# Patient Record
Sex: Female | Born: 1965 | Race: White | Hispanic: No | Marital: Married | Smoking: Never smoker
Health system: Southern US, Community
[De-identification: ages and names within clinical notes are randomized; demographics above are authoritative.]

## PROBLEM LIST (undated history)

## (undated) HISTORY — PX: ABDOMINAL HYSTERECTOMY: SHX81

---

## 2006-05-22 ENCOUNTER — Ambulatory Visit: Payer: Self-pay | Admitting: Obstetrics and Gynecology

## 2006-06-03 ENCOUNTER — Ambulatory Visit: Payer: Self-pay | Admitting: Obstetrics and Gynecology

## 2007-06-11 ENCOUNTER — Ambulatory Visit: Payer: Self-pay | Admitting: Obstetrics and Gynecology

## 2008-06-14 ENCOUNTER — Ambulatory Visit: Payer: Self-pay | Admitting: Obstetrics and Gynecology

## 2009-07-25 ENCOUNTER — Ambulatory Visit: Payer: Self-pay | Admitting: Obstetrics and Gynecology

## 2010-07-27 ENCOUNTER — Ambulatory Visit: Payer: Self-pay | Admitting: Obstetrics and Gynecology

## 2011-07-30 ENCOUNTER — Ambulatory Visit: Payer: Self-pay | Admitting: Obstetrics and Gynecology

## 2012-08-24 ENCOUNTER — Ambulatory Visit: Payer: Self-pay | Admitting: Obstetrics and Gynecology

## 2012-12-07 ENCOUNTER — Ambulatory Visit: Payer: Self-pay | Admitting: Obstetrics and Gynecology

## 2012-12-07 LAB — URINALYSIS, COMPLETE
Bilirubin,UR: NEGATIVE
Glucose,UR: NEGATIVE mg/dL (ref 0–75)
Ph: 5 (ref 4.5–8.0)
Protein: NEGATIVE
RBC,UR: 75 /HPF (ref 0–5)
Specific Gravity: 1.024 (ref 1.003–1.030)
Squamous Epithelial: 1
WBC UR: 8 /HPF (ref 0–5)

## 2012-12-14 ENCOUNTER — Inpatient Hospital Stay: Payer: Self-pay | Admitting: Obstetrics and Gynecology

## 2012-12-15 LAB — HEMATOCRIT: HCT: 29.4 % — ABNORMAL LOW (ref 35.0–47.0)

## 2013-08-25 ENCOUNTER — Ambulatory Visit: Payer: Self-pay | Admitting: Obstetrics and Gynecology

## 2014-04-29 NOTE — Op Note (Signed)
PATIENT NAME:  Tami Rivera, Tami Rivera MR#:  [id number] DATE OF BIRTH:  Sep 24, 1965  DATE OF PROCEDURE:  12/14/2012  PREOPERATIVE DIAGNOSES: Dysmenorrhea, menorrhagia, and fibroids.   POSITIVE DIAGNOSES:  Dysmenorrhea, menorrhagia, and fibroids.   PROCEDURE:  Subtotal abdominal hysterectomy and bilateral salpingectomy.   SURGEON: Ricky L. Logan BoresEvans, M.D.   ASSISTANT: Jennell Cornerhomas Schermerhorn,  M.D.   ANESTHESIA: General endotracheal.   FINDINGS: Enlarged fibroid uterus, grossly normal tubes and ovaries bilaterally.   ESTIMATED BLOOD LOSS: 75 mL.   COMPLICATIONS: None.   DRAINS: Foley.   SPECIMENS: As above.  PROCEDURE IN DETAIL: The patient was consented. Preoperative antibiotics were given. She was taken to the Operating Room and placed in the supine position where anesthesia was initiated. She was then placed in the dorsal lithotomy position using Allen stirrups, prepped and draped in the usual sterile fashion. Foley catheter was inserted and #10 blade was used to create a Pfannenstiel incision which was carried sharply down to the peritoneal cavity, self-retaining retractor was placed after bowel was packed with three moist laps. Double-tooth was placed in the dome of the uterus. [name] was placed in bilateral adnexal complexes. Round ligament was identified, ligated and divided. Broad ligament was developed. This was done on the right side as well and then doing clamp, cut and tie technique brought down to just above the uterosacrals bilaterally and then decapitated the uterus and the underlying cervix. Cervical canal was cauterized and imbricating stitch with 0 Vicryl was used to re-peritonealize the cervical stump. Copiously irrigated with water seen to be hemostatic. Packing removed. Self-retaining retractor was removed. Rectus muscle was inspected for hemostasis and seen to be excellent. Fascia was closed left to right with 0 Vicryl. Subcutaneous was made hemostatic with cautery clips were applied and  sterile dressing was applied. The patient tolerated the procedure well. All instrument, needle, and sponge counts were correct. I anticipate a routine postoperative course.    ____________________________ Reatha Harpsicky L. Logan BoresEvans, MD rle:sg D: 12/14/2012 08:34:24 ET T: 12/14/2012 09:05:25 ET JOB#: [id number]  cc: Clide Clifficky L. Logan BoresEvans, MD, <Dictator>  Augustina MoodICK L Fronia Depass MD ELECTRONICALLY SIGNED 12/16/2012 13:10

## 2016-07-02 ENCOUNTER — Emergency Department: Payer: BLUE CROSS/BLUE SHIELD

## 2016-07-02 ENCOUNTER — Emergency Department
Admission: EM | Admit: 2016-07-02 | Discharge: 2016-07-02 | Disposition: A | Discharge status: Home / Self Care | Payer: BLUE CROSS/BLUE SHIELD | Attending: Student in an Organized Health Care Education/Training Program | Admitting: Student in an Organized Health Care Education/Training Program

## 2016-07-02 ENCOUNTER — Encounter: Payer: Self-pay | Admitting: Emergency Medicine

## 2016-07-02 DIAGNOSIS — S62662B Nondisplaced fracture of distal phalanx of right middle finger, initial encounter for open fracture: Secondary | ICD-10-CM | POA: Diagnosis not present

## 2016-07-02 DIAGNOSIS — S61222A Laceration with foreign body of right middle finger without damage to nail, initial encounter: Secondary | ICD-10-CM | POA: Diagnosis present

## 2016-07-02 DIAGNOSIS — W312XXA Contact with powered woodworking and forming machines, initial encounter: Secondary | ICD-10-CM | POA: Insufficient documentation

## 2016-07-02 DIAGNOSIS — Y999 Unspecified external cause status: Secondary | ICD-10-CM | POA: Diagnosis not present

## 2016-07-02 DIAGNOSIS — S61212A Laceration without foreign body of right middle finger without damage to nail, initial encounter: Secondary | ICD-10-CM

## 2016-07-02 DIAGNOSIS — Y929 Unspecified place or not applicable: Secondary | ICD-10-CM | POA: Insufficient documentation

## 2016-07-02 DIAGNOSIS — Y93H3 Activity, building and construction: Secondary | ICD-10-CM | POA: Diagnosis not present

## 2016-07-02 MED ORDER — SULFAMETHOXAZOLE-TRIMETHOPRIM 800-160 MG PO TABS
1.0000 | ORAL_TABLET | Freq: Once | ORAL | Status: AC
  Administered 2016-07-02: 1 via ORAL
  Filled 2016-07-02: qty 1

## 2016-07-02 MED ORDER — LIDOCAINE HCL (PF) 1 % IJ SOLN
10.0000 mL | Freq: Once | INTRAMUSCULAR | Status: AC
  Administered 2016-07-02: 5 mL

## 2016-07-02 MED ORDER — OXYCODONE-ACETAMINOPHEN 7.5-325 MG PO TABS: 1.0000 | ORAL_TABLET | ORAL | 0 refills | Status: AC | PRN

## 2016-07-02 MED ORDER — LIDOCAINE HCL (PF) 1 % IJ SOLN
INTRAMUSCULAR | Status: AC
  Filled 2016-07-02: qty 5

## 2016-07-02 MED ORDER — OXYCODONE-ACETAMINOPHEN 5-325 MG PO TABS
1.0000 | ORAL_TABLET | Freq: Once | ORAL | Status: AC
  Administered 2016-07-02: 1 via ORAL
  Filled 2016-07-02: qty 1

## 2016-07-02 MED ORDER — LIDOCAINE HCL (PF) 1 % IJ SOLN
INTRAMUSCULAR | Status: AC
  Administered 2016-07-02: 5 mL
  Filled 2016-07-02: qty 5

## 2016-07-02 MED ORDER — SULFAMETHOXAZOLE-TRIMETHOPRIM 800-160 MG PO TABS: 1.0000 | ORAL_TABLET | Freq: Two times a day (BID) | ORAL | 0 refills | Status: DC

## 2016-07-02 NOTE — Discharge Instructions (Signed)
Wear splint until evaluation by orthopedic

## 2016-07-02 NOTE — ED Provider Notes (Signed)
[facility] Emergency Department Provider Note   ____________________________________________   First MD Initiated Contact with Patient 07/02/16 1148     (approximate)  I have reviewed the triage vital signs and the nursing notes.   HISTORY  Chief Complaint Laceration    HPI Tami Rivera is a 51 y.o. female patient complaining of laceration to third digit right hand second to a circular saw cut. Patient states she was helping a relative deck. Patient denies loss sensation or loss of function of the finger. Patient is right-hand dominant. Patient tetanus shot is up-to-date. Patient was sent over from [place] clinic for definitive evaluation and treatment.   History reviewed. No pertinent past medical history.  There are no active problems to display for this patient.   Past Surgical History:  Procedure Laterality Date  . ABDOMINAL HYSTERECTOMY      Prior to Admission medications   Medication Sig Start Date End Date Taking? Authorizing Provider  phentermine 15 MG capsule Take 15 mg by mouth every morning.   Yes [provider]  oxyCODONE-acetaminophen (PERCOCET) 7.5-325 MG tablet Take 1 tablet by mouth every 4 (four) hours as needed for severe pain. 07/02/16 07/02/17  Joni Reining, PA-C  sulfamethoxazole-trimethoprim (BACTRIM DS,SEPTRA DS) 800-160 MG tablet Take 1 tablet by mouth 2 (two) times daily. 07/02/16   Joni Reining, PA-C    Allergies Patient has no known allergies.  No family history on file.  Social History Social History  Substance Use Topics  . Smoking status: Never Smoker  . Smokeless tobacco: Never Used  . Alcohol use No    Review of Systems Constitutional: No fever/chills Eyes: No visual changes. ENT: No sore throat. Cardiovascular: Denies chest pain. Respiratory: Denies shortness of breath. Gastrointestinal: No abdominal pain.  No nausea, no vomiting.  No diarrhea.  No constipation. Genitourinary:  Negative for dysuria. Musculoskeletal: Negative for back pain. Skin: Laceration distal third right hand Neurological: Negative for headaches, focal weakness or numbness.   ____________________________________________   PHYSICAL EXAM:  VITAL SIGNS: ED Triage Vitals [07/02/16 1130]  Enc Vitals Group     BP (!) 142/79     Pulse Rate 72     Resp 18     Temp 97.7 F (36.5 C)     Temp Source Oral     SpO2 99 %     Weight 273 lb (123.8 kg)     Height 5\' 8"  (1.727 m)     Head Circumference      Peak Flow      Pain Score 8     Pain Loc      Pain Edu?      Excl. in GC?     Constitutional: Alert and oriented. Well appearing and in no acute distress. Cardiovascular: Normal rate, regular rhythm. Grossly normal heart sounds.  Good peripheral circulation. Respiratory: Normal respiratory effort.  No retractions. Lungs CTAB. Gastrointestinal: Soft and nontender. No distention. No abdominal bruits. No CVA tenderness. Neurologic:  Normal speech and language. No gross focal neurologic deficits are appreciated. No gait instability. Skin:  Skin is warm, dry and intact. No rash noted.Laceration third digit right hand Psychiatric: Mood and affect are normal. Speech and behavior are normal.  ____________________________________________   LABS (all labs ordered are listed, but only abnormal results are displayed)  Labs Reviewed - No data to display ____________________________________________  EKG   ____________________________________________  RADIOLOGY  Dg Finger Middle Right  Result Date: 07/02/2016 CLINICAL DATA:  Sol blade injury  of the right third digit. EXAM: RIGHT MIDDLE FINGER 2+V COMPARISON:  None in PACs FINDINGS: The patient has sustained an open fracture through the ulnar aspect of the base of the distal phalanx of the third finger. The fracture is intra-articular. The proximal and middle phalanges are intact. There is disruption of the soft tissues of the distal phalanx.  With radiopaque foreign material IMPRESSION: Open intra-articular slightly distracted fracture through the ulnar aspect of the base of the distal phalanx of the right third finger. Electronically Signed   By: David  SwazilandJordan M.D.   On: 07/02/2016 12:24    ___________ _________________________________   PROCEDURES  Procedure(s) performed: LACERATION REPAIR Performed by: Joni Reiningonald K Tallulah Hosman Authorized by: Joni Reiningonald K Johnie Stadel Consent: Verbal consent obtained. Risks and benefits: risks, benefits and alternatives were discussed Consent given by: patient Patient identity confirmed: provided demographic data Prepped and Draped in normal sterile fashion Wound explored  Laceration Location: Distal third digit right hand Laceration Length: 1.5 cm   Foreign Bodies seen and removed for forceps and irrigation Anesthesia: Digital block   Local anesthetic: lidocaine 1% without epinephrine.   Anesthetic total: 6 mL Irrigation method: syringe Amount of cleaning: standard  Skin closure: Nylon Number of sutures: 9 Technique: Interrupted Patient tolerance: Patient tolerated the procedure well with no immediate complications.   Procedures  Critical Care performed: No  ____________________________________________   INITIAL IMPRESSION / ASSESSMENT AND PLAN / ED COURSE  Pertinent labs & imaging results that were available during my care of the patient were reviewed by me and considered in my medical decision making (see chart for details).  Open fracture distal phalange third digit right hand. Area was thoroughly irrigated and loose approximated the patient can follow-up with orthopedics. Patient started on antibiotics in clinic. Advised to wear his splint until evaluation by orthopedic clinic      ____________________________________________   FINAL CLINICAL IMPRESSION(S) / ED DIAGNOSES  Final diagnoses:  Laceration of right middle finger, foreign body presence unspecified, nail damage status  unspecified, initial encounter      NEW MEDICATIONS STARTED DURING THIS VISIT:  New Prescriptions   OXYCODONE-ACETAMINOPHEN (PERCOCET) 7.5-325 MG TABLET    Take 1 tablet by mouth every 4 (four) hours as needed for severe pain.   SULFAMETHOXAZOLE-TRIMETHOPRIM (BACTRIM DS,SEPTRA DS) 800-160 MG TABLET    Take 1 tablet by mouth 2 (two) times daily.     Note:  This document was prepared using Dragon voice recognition software and may include unintentional dictation errors.    Joni ReiningSmith, Oz Gammel K, PA-C 07/02/16 1309    Willy Eddyobinson, Patrick, MD 07/02/16 [phone]

## 2016-07-02 NOTE — ED Triage Notes (Signed)
Presents with laceration to right middle finger with saw  States she was help her father build a deck

## 2018-01-10 IMAGING — CR DG FINGER MIDDLE 2+V*R*
3 series · 3 of 3 positions shown · non-contrast
Comparison: None in PACs

CLINICAL DATA: Kiersten Opara injury of the right third digit.

EXAM:
RIGHT MIDDLE FINGER 2+V

[finger ap]
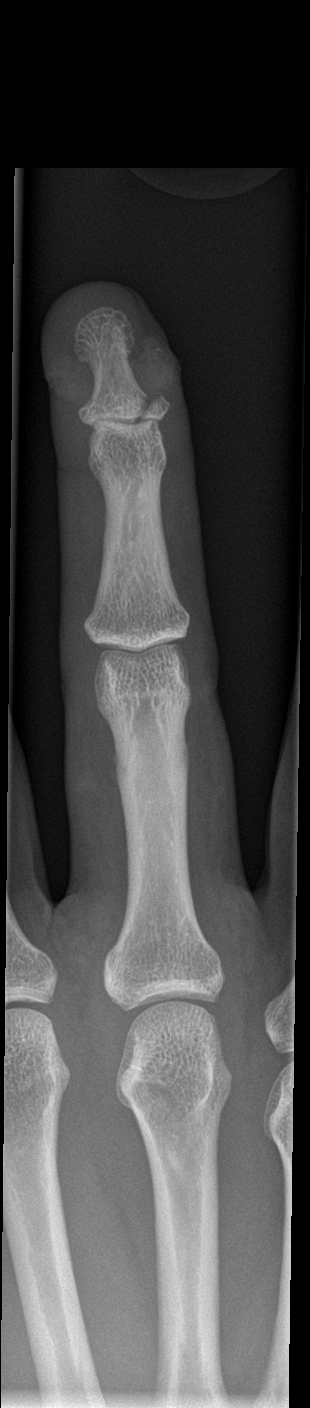

[finger obl]
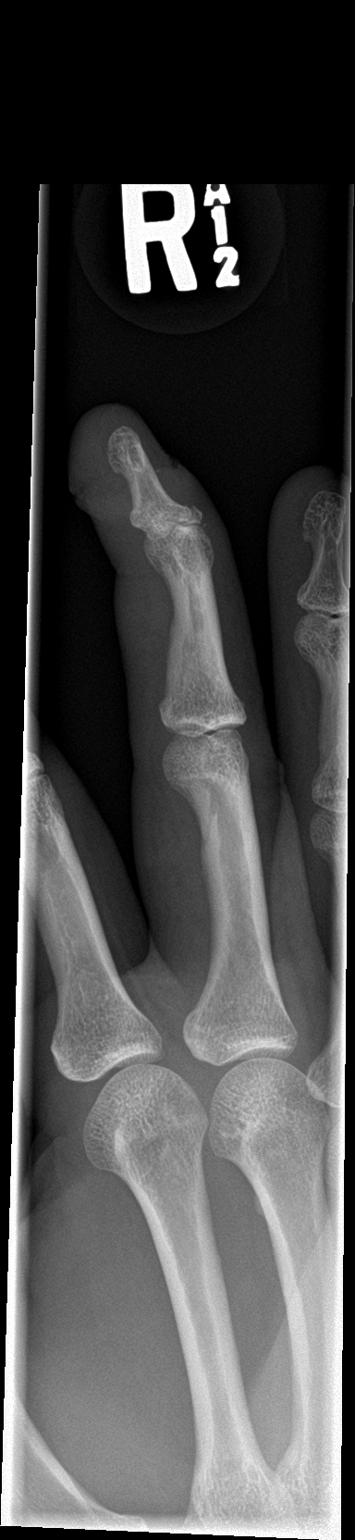

[finger lat]
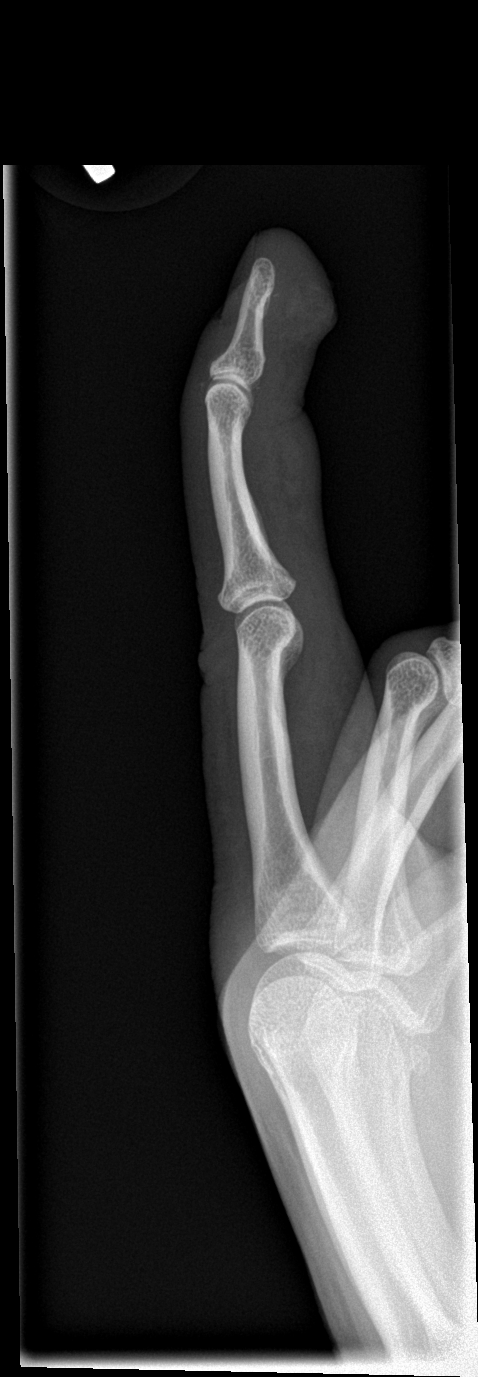

[3 of 3 positions shown; findings below may reference images not displayed]

FINDINGS: The patient has sustained an open fracture through the ulnar aspect
of the base of the distal phalanx of the third finger. The fracture
is intra-articular. The proximal and middle phalanges are intact.
There is disruption of the soft tissues of the distal phalanx. With
radiopaque foreign material
IMPRESSION: Open intra-articular slightly distracted fracture through the ulnar
aspect of the base of the distal phalanx of the right third finger.

## 2018-09-22 ENCOUNTER — Telehealth: Payer: Self-pay

## 2018-09-22 NOTE — Telephone Encounter (Signed)
Message reviewed and patient called. No fevers, more sinus congestion now, a little bit of drainage, more clear drainage. Some mild cough, mild HA, not marked aches Taking OTC med wth expectorant and DM and acetaminophen and helps some. This time of year gets seasonal allergies.  Slept near a vent in the mountains in [place] when there on a recent trip. No loss of taste or smell or known Covid exposures  Educated on possible viral URI with allergic rhinitis as well, feel much less likely Covid. Can continue the OTC product for any mild cough or congestion and adding a non-sedating antihistamine rec'ed and also a generic flonase nasal spray - up to twice daily for first three days, then once daily.  No added tylenol as in the OTC product, and can use ibuprofen prn alternating to help.   Noted have a low yield for Covid testing presently, and since works at home can hold on going today, but noted if sx's not improving with the above over the next couple days, would be good to get tested to make sure not Covid.  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  If not getting better or worsening, to f/u as well.

## 2018-11-29 DIAGNOSIS — Z20828 Contact with and (suspected) exposure to other viral communicable diseases: Secondary | ICD-10-CM | POA: Diagnosis not present

## 2019-03-31 DIAGNOSIS — J019 Acute sinusitis, unspecified: Secondary | ICD-10-CM | POA: Diagnosis not present

## 2019-03-31 DIAGNOSIS — Z03818 Encounter for observation for suspected exposure to other biological agents ruled out: Secondary | ICD-10-CM | POA: Diagnosis not present

## 2019-03-31 DIAGNOSIS — B9689 Other specified bacterial agents as the cause of diseases classified elsewhere: Secondary | ICD-10-CM | POA: Diagnosis not present

## 2019-06-03 DIAGNOSIS — H1132 Conjunctival hemorrhage, left eye: Secondary | ICD-10-CM | POA: Diagnosis not present

## 2019-12-06 DIAGNOSIS — S93601A Unspecified sprain of right foot, initial encounter: Secondary | ICD-10-CM | POA: Diagnosis not present

## 2021-03-12 ENCOUNTER — Encounter: Payer: Self-pay | Admitting: Physician Assistant

## 2021-03-12 ENCOUNTER — Other Ambulatory Visit: Payer: Self-pay

## 2021-03-12 ENCOUNTER — Ambulatory Visit: Payer: Self-pay | Admitting: Physician Assistant

## 2021-03-12 VITALS — BP 127/73 | HR 81 | Temp 98.0°F | Resp 14 | Ht 68.0 in | Wt 289.0 lb

## 2021-03-12 DIAGNOSIS — J029 Acute pharyngitis, unspecified: Secondary | ICD-10-CM

## 2021-03-12 DIAGNOSIS — J069 Acute upper respiratory infection, unspecified: Secondary | ICD-10-CM

## 2021-03-12 DIAGNOSIS — Z1152 Encounter for screening for COVID-19: Secondary | ICD-10-CM

## 2021-03-12 LAB — POCT RAPID STREP A (OFFICE): Rapid Strep A Screen: NEGATIVE

## 2021-03-12 LAB — POC COVID19 BINAXNOW: SARS Coronavirus 2 Ag: NEGATIVE

## 2021-03-12 MED ORDER — METHYLPREDNISOLONE 4 MG PO TBPK: ORAL_TABLET | ORAL | 0 refills | Status: DC

## 2021-03-12 MED ORDER — PSEUDOEPH-BROMPHEN-DM 30-2-10 MG/5ML PO SYRP: 5.0000 mL | ORAL_SOLUTION | Freq: Four times a day (QID) | ORAL | 0 refills | Status: DC | PRN

## 2021-03-12 MED ORDER — LIDOCAINE VISCOUS HCL 2 % MT SOLN: 5.0000 mL | Freq: Four times a day (QID) | OROMUCOSAL | 0 refills | Status: DC | PRN

## 2021-03-12 NOTE — Progress Notes (Signed)
Pt stating she's doing better then 03/05/21 but throat still starchy, cough and chest feeling  tightness from coughing. ?

## 2021-03-12 NOTE — Progress Notes (Signed)
Pt presents today with sore throat and congestion with cough since 03/05/21.  ? ?Negative rapid covid and strept today. ?

## 2021-03-12 NOTE — Progress Notes (Signed)
? ?  Subjective: Cough and sore throat  ? ? Patient ID: Tami Rivera, female    DOB: 20-Mar-1965, 56 y.o.   MRN: [id number] ? ?HPI ?Patient complaining of 1 week of cough and sore throat.  Patient states intermitting nasal congestion.  Patient denies fevers or chills.  Patient cough is worse when laying down.  Denies recent travel or known contact COVID-19.  Patient tested negative for strep pharyngitis and COVID-19. ? ? ?Review of Systems ?Negative except for chief complaint ?   ?Objective:  ? Physical Exam ? ?No acute distress.  Temperature is 98, pulse 81, respiration 14, BP is 127/73, and patient 90% O2 sat on room air.  Patient weighs 289 pounds BMI is 43.94. ?HEENT is remarkable for postnasal drainage and erythematous pharynx.  No exudate.  No cervical lymphadenopathy.  Lungs are clear to auscultation.  Heart regular rate and rhythm. ? ? ?   ?Assessment & Plan: Viral pharyngitis  ? ? ?

## 2021-08-07 ENCOUNTER — Encounter: Payer: Self-pay | Admitting: Physician Assistant

## 2021-08-07 ENCOUNTER — Ambulatory Visit: Payer: Self-pay | Admitting: Physician Assistant

## 2021-08-07 VITALS — BP 127/80 | HR 82 | Temp 96.8°F | Resp 16 | Wt 201.0 lb

## 2021-08-07 DIAGNOSIS — R053 Chronic cough: Secondary | ICD-10-CM

## 2021-08-07 MED ORDER — HYDROCOD POLI-CHLORPHE POLI ER 10-8 MG/5ML PO SUER: 5.0000 mL | Freq: Every evening | ORAL | 0 refills | Status: DC | PRN

## 2021-08-07 MED ORDER — BENZONATATE 200 MG PO CAPS: 200.0000 mg | ORAL_CAPSULE | Freq: Three times a day (TID) | ORAL | 0 refills | Status: DC | PRN

## 2021-08-07 NOTE — Progress Notes (Signed)
   Subjective: Cough    Patient ID: Tami Rivera, female    DOB: September 12, 1965, 56 y.o.   MRN: [id number]  HPI Patient complain of cough for 4 weeks.  Patient states cough is productive with a.m. awakening and is nonproductive throughout the day.  Denies fever chills associated complaint.  Denies night sweats.  Denies recent travel or known contact with COVID-19.  Patient voiced concern because she is planning on going to the beach for 1 week.   Review of Systems Negative except for chief complaint    Objective:   Physical Exam  BP is 127/80, pulse 82, respirations 16, temperature 96.8, patient 96% O2 sat on room air.  Patient weighs 201 pounds BMI is 30.56. HEENT is unremarkable.  Neck is supple without lymphadenectomy or bruits.  Lungs are clear to auscultation.  Heart regular rate and rhythm.      Assessment & Plan: Cough  Differentials consist of viral URI, bronchitis, or pneumonia.  Patient given a prescription for Tessalon Perles to take 3 times daily.  Patient also given a prescription for Tussionex and advised to take 5 mL before going to bed.  Patient will follow-up as necessary.

## 2021-08-07 NOTE — Progress Notes (Signed)
C/O cough intermittent 5-6 weeks stating nonproductive daytime with some productive cough in am, no dyspnea at rest noted but states mild dyspnea with exertion in heat.  No wheezing noted at this time at rest.  No c/o fever.

## 2022-05-15 ENCOUNTER — Ambulatory Visit: Payer: Self-pay | Admitting: Emergency Medicine

## 2022-05-15 VITALS — BP 132/83 | HR 70 | Temp 97.8°F | Resp 16

## 2022-05-15 DIAGNOSIS — J302 Other seasonal allergic rhinitis: Secondary | ICD-10-CM

## 2022-05-15 DIAGNOSIS — R059 Cough, unspecified: Secondary | ICD-10-CM

## 2022-05-15 LAB — POC COVID19 BINAXNOW: SARS Coronavirus 2 Ag: NEGATIVE

## 2022-05-15 MED ORDER — FLUTICASONE PROPIONATE 50 MCG/ACT NA SUSP: 2.0000 | Freq: Every day | NASAL | 6 refills | Status: DC

## 2022-05-15 MED ORDER — BENZONATATE 200 MG PO CAPS: 200.0000 mg | ORAL_CAPSULE | Freq: Three times a day (TID) | ORAL | 0 refills | Status: DC | PRN

## 2022-05-15 MED ORDER — PREDNISONE 10 MG PO TABS: ORAL_TABLET | ORAL | 0 refills | Status: DC

## 2022-05-15 NOTE — Progress Notes (Signed)
C/O productive at time cough that persists x 2 weeks.  Tested negative for covid in parking lot prior to clinic visit.  Stated Zyrtec not working and using Benadryl at night and Chloricedin daytime.  No fever with mild sinus pressure, but nasal drainage excessive reported from usual.

## 2022-05-15 NOTE — Progress Notes (Unsigned)
57 year old female presents to the clinic with upper respiratory issues.  Patient has been taking Zyrtec and Benadryl without any relief.  Patient reports nasal congestion and a scratchy cough mostly at night.  She denies any fever, chills, nausea or vomiting.  O: EACs with minimal fluid, no erythema or injection is noted.  Nasal mucosa pale with boggy turbinates especially on the right.  Posterior pharynx without erythema but mild injection with drainage noted posteriorly.  Neck is supple with minimal adenopathy.  Lungs are clear bilaterally.  A: Sinus pressure/seasonal allergies  P: Patient was placed on a prescription for Flonase nasal spray 2 sprays to each nostril once a day, prednisone 30 mg daily for 5 days and Tessalon Perles 200 mg 1 3 times daily as needed for cough.  Patient is return if any continued problems.  Also her COVID test in the parking lot was negative.

## 2023-05-01 ENCOUNTER — Other Ambulatory Visit: Payer: Self-pay | Admitting: Physician Assistant

## 2023-05-01 ENCOUNTER — Telehealth: Payer: Self-pay

## 2023-05-01 MED ORDER — BENZONATATE 200 MG PO CAPS: 200.0000 mg | ORAL_CAPSULE | Freq: Three times a day (TID) | ORAL | 0 refills | Status: DC | PRN

## 2023-05-01 MED ORDER — PREDNISONE 10 MG PO TABS: ORAL_TABLET | ORAL | 0 refills | Status: DC

## 2023-05-01 NOTE — Telephone Encounter (Signed)
 Note sent to provider of voiced day 10 of upper respiratory c/o cough, denies fever or dyspnea, but states is in her chest more than her head.  Appt scheduled for Monday and physical set for July 2025.

## 2023-05-05 ENCOUNTER — Other Ambulatory Visit: Payer: Self-pay | Admitting: Physician Assistant

## 2023-05-05 ENCOUNTER — Ambulatory Visit: Payer: Self-pay | Admitting: Physician Assistant

## 2023-05-05 ENCOUNTER — Encounter: Payer: Self-pay | Admitting: Physician Assistant

## 2023-05-05 VITALS — BP 148/83 | HR 78 | Temp 98.4°F | Resp 16

## 2023-05-05 DIAGNOSIS — R059 Cough, unspecified: Secondary | ICD-10-CM

## 2023-05-05 MED ORDER — TRIAMCINOLONE ACETONIDE 40 MG/ML IJ SUSP
40.0000 mg | Freq: Once | INTRAMUSCULAR | Status: AC
  Administered 2023-05-05: 40 mg via INTRAMUSCULAR

## 2023-05-05 MED ORDER — FLUTICASONE PROPIONATE 50 MCG/ACT NA SUSP: 2.0000 | Freq: Every day | NASAL | 6 refills | Status: AC

## 2023-05-05 NOTE — Progress Notes (Signed)
 Still coughing some - but meds already sent in have helped.  Productive cough - not clear, but no dicoloration

## 2023-05-05 NOTE — Progress Notes (Signed)
   Subjective: Cough    Patient ID: Tami Rivera, female    DOB: 12/14/1965, 58 y.o.   MRN: [id number]  HPI  Patient presents for intermitting productive nonproductive cough for 1 and half weeks.  Patient was given a prescription for Tessalon  Perles and Medrol  Dosepak 4 days ago.  Patient states much improvement.  Denies recent travel or known contact with COVID-19.  Denies dyspnea.  Request evaluation for pneumonia versus bronchitis.  Further history shows t there is a seasonal component to her complaint. Review of Systems Negative except for above complaint    Objective:   Physical Exam BP 148/83BP. 148/83. Data is abnormal. Taken on 05/05/23 3:45 PM  BP Location Left Arm  Patient Position Sitting  Cuff Size Large  Pulse Rate 78  Temp 98.4 F (36.9 C)  Temp Source Temporal  Resp 16  SpO2 95 %  No acute distress. HEENT is unremarkable. Neck is supple lymphadenopathy or bruits. Lungs are clear to auscultation.  No coughing throughout exam. Heart regular rate and rhythm.       Assessment & Plan: Seasonal cough  Patient given 40 mg Kenalog IM and advised to take Tessalon  Perles as directed.  Patient advised return back to clinic if condition worsens.

## 2023-05-05 NOTE — Progress Notes (Unsigned)
   Subjective: Cough    Patient ID: Tami Rivera, female    DOB: [birth year], 58 y.o.   MRN: [id number]  HPI Patient complain intermittently experiencing productive nonproductive cough for 1-1/2 weeks.  Patient was prescribed Tessalon  Perles and prednisone  4 days ago and states much improvement.  Patient requests evaluation to find out if she is having pneumonia or bronchitis.  Denies dyspnea.  No fever/chills.  No recent travel or known contact with COVID-19.   Review of Systems Morbid obesity.    Objective:   Physical Exam        Assessment & Plan:

## 2023-07-15 ENCOUNTER — Ambulatory Visit: Payer: Self-pay

## 2023-07-15 DIAGNOSIS — Z0000 Encounter for general adult medical examination without abnormal findings: Secondary | ICD-10-CM

## 2023-07-15 DIAGNOSIS — R8290 Unspecified abnormal findings in urine: Secondary | ICD-10-CM

## 2023-07-15 LAB — POCT URINALYSIS DIPSTICK
Bilirubin, UA: NEGATIVE
Glucose, UA: NEGATIVE
Ketones, UA: NEGATIVE
Nitrite, UA: POSITIVE — AB
Protein, UA: POSITIVE — AB
Spec Grav, UA: 1.025 (ref 1.010–1.025)
Urobilinogen, UA: 0.2 U/dL
pH, UA: 5.5 (ref 5.0–8.0)

## 2023-07-16 LAB — CMP12+LP+TP+TSH+6AC+CBC/D/PLT
ALT: 30 IU/L (ref 0–32)
AST: 22 IU/L (ref 0–40)
Albumin: 4.2 g/dL (ref 3.8–4.9)
Alkaline Phosphatase: 99 IU/L (ref 44–121)
BUN/Creatinine Ratio: 17 (ref 9–23)
BUN: 12 mg/dL (ref 6–24)
Basophils Absolute: 0.1 x10E3/uL (ref 0.0–0.2)
Basos: 1 %
Bilirubin Total: 0.3 mg/dL (ref 0.0–1.2)
Calcium: 9.2 mg/dL (ref 8.7–10.2)
Chloride: 104 mmol/L (ref 96–106)
Chol/HDL Ratio: 3.7 ratio (ref 0.0–4.4)
Cholesterol, Total: 190 mg/dL (ref 100–199)
Creatinine, Ser: 0.69 mg/dL (ref 0.57–1.00)
EOS (ABSOLUTE): 0.2 x10E3/uL (ref 0.0–0.4)
Eos: 2 %
Estimated CHD Risk: 0.7 times avg. (ref 0.0–1.0)
Free Thyroxine Index: 2.2 (ref 1.2–4.9)
GGT: 20 IU/L (ref 0–60)
Globulin, Total: 2.2 g/dL (ref 1.5–4.5)
Glucose: 82 mg/dL (ref 70–99)
HDL: 52 mg/dL (ref >39)
Hematocrit: 42 % (ref 34.0–46.6)
Hemoglobin: 13.6 g/dL (ref 11.1–15.9)
Immature Grans (Abs): 0 x10E3/uL (ref 0.0–0.1)
Immature Granulocytes: 0 %
Iron: 69 ug/dL (ref 27–159)
LDH: 232 IU/L — ABNORMAL HIGH (ref 119–226)
LDL Chol Calc (NIH): 120 mg/dL — ABNORMAL HIGH (ref 0–99)
Lymphocytes Absolute: 1.9 x10E3/uL (ref 0.7–3.1)
Lymphs: 26 %
MCH: 29.2 pg (ref 26.6–33.0)
MCHC: 32.4 g/dL (ref 31.5–35.7)
MCV: 90 fL (ref 79–97)
Monocytes Absolute: 0.6 x10E3/uL (ref 0.1–0.9)
Monocytes: 8 %
Neutrophils Absolute: 4.6 x10E3/uL (ref 1.4–7.0)
Neutrophils: 63 %
Phosphorus: 3.1 mg/dL (ref 3.0–4.3)
Platelets: 232 x10E3/uL (ref 150–450)
Potassium: 4.1 mmol/L (ref 3.5–5.2)
RBC: 4.65 x10E6/uL (ref 3.77–5.28)
RDW: 13.5 % (ref 11.7–15.4)
Sodium: 142 mmol/L (ref 134–144)
T3 Uptake Ratio: 24 % (ref 24–39)
T4, Total: 9.1 ug/dL (ref 4.5–12.0)
TSH: 2.03 u[IU]/mL (ref 0.450–4.500)
Total Protein: 6.4 g/dL (ref 6.0–8.5)
Triglycerides: 99 mg/dL (ref 0–149)
Uric Acid: 6.7 mg/dL (ref 3.0–7.2)
VLDL Cholesterol Cal: 18 mg/dL (ref 5–40)
WBC: 7.2 x10E3/uL (ref 3.4–10.8)
eGFR: 101 mL/min/1.73 (ref >59)

## 2023-07-17 ENCOUNTER — Other Ambulatory Visit: Payer: Self-pay | Admitting: Physician Assistant

## 2023-07-17 DIAGNOSIS — Z1231 Encounter for screening mammogram for malignant neoplasm of breast: Secondary | ICD-10-CM

## 2023-07-19 LAB — URINE CULTURE

## 2023-07-21 ENCOUNTER — Other Ambulatory Visit: Payer: Self-pay | Admitting: Physician Assistant

## 2023-07-21 MED ORDER — PHENAZOPYRIDINE HCL 95 MG PO TABS: 95.0000 mg | ORAL_TABLET | Freq: Three times a day (TID) | ORAL | 0 refills | Status: AC | PRN

## 2023-07-21 MED ORDER — NITROFURANTOIN MONOHYD MACRO 100 MG PO CAPS: 100.0000 mg | ORAL_CAPSULE | Freq: Two times a day (BID) | ORAL | 0 refills | Status: DC

## 2023-07-22 ENCOUNTER — Ambulatory Visit: Payer: Self-pay | Admitting: Physician Assistant

## 2023-07-22 ENCOUNTER — Encounter: Payer: Self-pay | Admitting: Physician Assistant

## 2023-07-22 VITALS — BP 135/77 | HR 69 | Temp 97.6°F | Resp 16 | Ht 68.0 in | Wt 313.0 lb

## 2023-07-22 DIAGNOSIS — Z0000 Encounter for general adult medical examination without abnormal findings: Secondary | ICD-10-CM

## 2023-07-22 DIAGNOSIS — N3000 Acute cystitis without hematuria: Secondary | ICD-10-CM

## 2023-07-22 NOTE — Progress Notes (Signed)
 [facility] {** REMINDER -  ____________________________________________   None    (approximate)  I have reviewed the triage vital signs and the nursing notes.   HISTORY  Chief Complaint No chief complaint on file.   HPI Tami Rivera is a 58 y.o. female patient presents for physical exam.  Patient voiced no concerns or complaints.        No past medical history on file.  There are no active problems to display for this patient.   Past Surgical History:  Procedure Laterality Date   ABDOMINAL HYSTERECTOMY      Prior to Admission medications   Medication Sig Start Date End Date Taking? Authorizing Provider  benzonatate  (TESSALON ) 200 MG capsule Take 1 capsule (200 mg total) by mouth 3 (three) times daily as needed. 05/01/23 04/30/24  Claudene Tanda POUR, PA-C  cetirizine (ZYRTEC) 10 MG tablet Take 10 mg by mouth daily.    [provider]  fluticasone  (FLONASE ) 50 MCG/ACT nasal spray Place 2 sprays into both nostrils daily. 05/05/23   Claudene Tanda POUR, PA-C  nitrofurantoin , macrocrystal-monohydrate, (MACROBID ) 100 MG capsule Take 1 capsule (100 mg total) by mouth 2 (two) times daily. 07/21/23   Claudene Tanda POUR, PA-C  phenazopyridine  (PYRIDIUM ) 95 MG tablet Take 1 tablet (95 mg total) by mouth 3 (three) times daily as needed for pain. 07/21/23   Claudene Tanda POUR, PA-C  predniSONE  (DELTASONE ) 10 MG tablet Take 3 tablets once a day for 5 days 05/01/23   Claudene Tanda POUR, PA-C    Allergies Patient has no known allergies.  No family history on file.  Social History Social History   Tobacco Use   Smoking status: Never   Smokeless tobacco: Never  Substance Use Topics   Alcohol use: No   Drug use: No    Review of Systems Constitutional: No fever/chills.  Morbid obesity Eyes: No visual changes. ENT: No sore throat. Cardiovascular: Denies chest pain. Respiratory: Denies shortness of breath. Gastrointestinal: No abdominal pain.  No  nausea, no vomiting.  No diarrhea.  No constipation. Genitourinary: Negative for dysuria. Musculoskeletal: Negative for back pain. Skin: Negative for rash. Neurological: Negative for headaches, focal weakness or numbness.   ____________________________________________   PHYSICAL EXAM:  VITAL SIGNS: P 135/77  Cuff Size Large  Pulse Rate 69  Temp 97.6 F (36.4 C)  Temp Source Temporal  Weight 313 lb (142 kg)  Height 5' 8 (1.727 m)  Resp 16  SpO2 93 %   BMI: 47.59 kg/m2  BSA: 2.61 m2   Constitutional: Alert and oriented. Well appearing and in no acute distress. Eyes: Conjunctivae are normal. PERRL. EOMI. Head: Atraumatic. Nose: No congestion/rhinnorhea. Mouth/Throat: Mucous membranes are moist.  Oropharynx non-erythematous. Neck: No stridor.  No cervical spine tenderness to palpation. Hematological/Lymphatic/Immunilogical: No cervical lymphadenopathy. Cardiovascular: Normal rate, regular rhythm. Grossly normal heart sounds.  Good peripheral circulation. Respiratory: Normal respiratory effort.  No retractions. Lungs CTAB. Gastrointestinal: Soft and nontender. No distention. No abdominal bruits. No CVA tenderness. Genitourinary: Deferred Musculoskeletal: No lower extremity tenderness nor edema.  No joint effusions. Neurologic:  Normal speech and language. No gross focal neurologic deficits are appreciated. No gait instability. Skin:  Skin is warm, dry and intact. No rash noted. Psychiatric: Mood and affect are normal. Speech and behavior are normal.  ____________________________________________   LABS _    Component Ref Range & Units (hover) 7 d ago  Color, UA dark yellow  Clarity, UA hazy  Glucose, UA Negative  Bilirubin,  UA neg  Ketones, UA neg  Spec Grav, UA 1.025  Blood, UA 2+ Abnormal   pH, UA 5.5  Protein, UA Positive Abnormal   Comment: trace -+  Urobilinogen, UA 0.2  Nitrite, UA positive Abnormal   Leukocytes, UA Trace Abnormal   Appearance   Odor        Ref Range & Units (hover) 7 d ago  Urine Culture, Routine Final report Abnormal   Organism ID, Bacteria Escherichia coli Abnormal   Comment: Cefazolin with an MIC <=16 predicts susceptibility to the oral agents cefaclor, cefdinir, cefpodoxime, cefprozil, cefuroxime, cephalexin, and loracarbef when used for therapy of uncomplicated urinary tract infections due to E. coli, Klebsiella pneumoniae, and Proteus mirabilis. Multi-Drug Resistant Organism Greater than 100,000 colony forming units per mL  Antimicrobial Susceptibility Comment  Comment:       ** S = Susceptible; I = Intermediate; R = Resistant **                    P = Positive; N = Negative             MICS are expressed in micrograms per mL    Antibiotic                 RSLT#1    RSLT#2    RSLT#3    RSLT#4 Amoxicillin/Clavulanic Acid    S Ampicillin                     S Cefazolin                      S Cefepime                       S Cefoxitin                      S Cefpodoxime                    S Ceftriaxone                    S Ciprofloxacin                  S Ertapenem                      S Gentamicin                     S Levofloxacin                   S Meropenem                      S Nitrofurantoin                  S Piperacillin/Tazobactam        S Tetracycline                   S Tobramycin                     S Trimethoprim /Sulfa              S       Component Ref Range & Units (hover) 7 d ago  Glucose 82  Uric Acid 6.7  Comment:            Therapeutic target  for gout patients: <6.0  BUN 12  Creatinine, Ser 0.69  eGFR 101  BUN/Creatinine Ratio 17  Sodium 142  Potassium 4.1  Chloride 104  Calcium 9.2  Phosphorus 3.1  Total Protein 6.4  Albumin 4.2  Globulin, Total 2.2  Bilirubin Total 0.3  Alkaline Phosphatase 99  LDH 232 High   AST 22  ALT 30  GGT 20  Iron 69  Cholesterol, Total 190  Triglycerides 99  HDL 52  VLDL Cholesterol Cal 18  LDL Chol Calc (NIH) 120 High   Chol/HDL  Ratio 3.7  Comment:                                   T. Chol/HDL Ratio                                             Men  Women                               1/2 Avg.Risk  3.4    3.3                                   Avg.Risk  5.0    4.4                                2X Avg.Risk  9.6    7.1                                3X Avg.Risk 23.4   11.0  Estimated CHD Risk 0.7  Comment: The CHD Risk is based on the T. Chol/HDL ratio. Other factors affect CHD Risk such as hypertension, smoking, diabetes, severe obesity, and family history of premature CHD.  TSH 2.030  T4, Total 9.1  T3 Uptake Ratio 24  Free Thyroxine Index 2.2  WBC 7.2  RBC 4.65  Hemoglobin 13.6  Hematocrit 42.0  MCV 90  MCH 29.2  MCHC 32.4  RDW 13.5  Platelets 232  Neutrophils 63  Lymphs 26  Monocytes 8  Eos 2  Basos 1  Neutrophils Absolute 4.6  Lymphocytes Absolute 1.9  Monocytes Absolute 0.6  EOS (ABSOLUTE) 0.2  Basophils Absolute 0.1  Immature Granulocytes 0  Immature Grans (Abs) 0.0                 ___________________________________________  EKG  Sinus rhythm at 72 bpm.  Left atrial enlargement. ____________________________________________    ____________________________________________   INITIAL IMPRESSION / ASSESSMENT AND PLAN  As part of my medical decision making, I reviewed the following data within the electronic MEDICAL RECORD NUMBER       No acute findings for his exam or EKG.  Lab results are consistent for findings of UTI.  Patient given prescription for Macrobid  and Pyridium .  Patient advised to have her urine rechecked in 10 days.      ____________________________________________   FINAL CLINICAL IMPRESSION    ED Discharge Orders     None        Note:  This document was prepared using  Dragon Chemical engineer and may include unintentional dictation errors.

## 2023-07-22 NOTE — Progress Notes (Signed)
Pt presents today to complete physical.  

## 2023-07-30 ENCOUNTER — Encounter: Payer: Self-pay | Admitting: Physician Assistant

## 2023-08-01 ENCOUNTER — Ambulatory Visit
Admission: RE | Admit: 2023-08-01 | Discharge: 2023-08-01 | Disposition: A | Discharge status: Home / Self Care | Source: Ambulatory Visit | Attending: Physician Assistant | Admitting: Physician Assistant

## 2023-08-01 ENCOUNTER — Other Ambulatory Visit: Payer: Self-pay

## 2023-08-01 DIAGNOSIS — Z87440 Personal history of urinary (tract) infections: Secondary | ICD-10-CM

## 2023-08-01 DIAGNOSIS — Z1231 Encounter for screening mammogram for malignant neoplasm of breast: Secondary | ICD-10-CM

## 2023-08-01 LAB — POCT URINALYSIS DIPSTICK
Bilirubin, UA: NEGATIVE
Glucose, UA: NEGATIVE
Ketones, UA: NEGATIVE
Leukocytes, UA: NEGATIVE
Nitrite, UA: NEGATIVE
Protein, UA: POSITIVE — AB
Spec Grav, UA: 1.01 (ref 1.010–1.025)
Urobilinogen, UA: 0.2 U/dL
pH, UA: 7 (ref 5.0–8.0)

## 2023-08-03 LAB — URINE CULTURE

## 2024-01-13 ENCOUNTER — Encounter: Payer: Self-pay | Admitting: Physician Assistant

## 2024-01-13 ENCOUNTER — Ambulatory Visit: Admitting: Physician Assistant

## 2024-01-13 DIAGNOSIS — L250 Unspecified contact dermatitis due to cosmetics: Secondary | ICD-10-CM

## 2024-01-13 MED ORDER — HYDROXYZINE PAMOATE 25 MG PO CAPS: 25.0000 mg | ORAL_CAPSULE | Freq: Three times a day (TID) | ORAL | 0 refills | Status: DC | PRN

## 2024-01-13 MED ORDER — METHYLPREDNISOLONE 4 MG PO TBPK: ORAL_TABLET | ORAL | 0 refills | Status: AC

## 2024-01-13 NOTE — Progress Notes (Unsigned)
 Pt presents today with both eye lids in slight pain. She states they burn and itch (only the skin around her eyes) eyelids and bottom.

## 2024-01-13 NOTE — Progress Notes (Unsigned)
" ° °  Subjective: Swollen eyelids    Patient ID: Tami Rivera, female    DOB: 06-Mar-1965, 59 y.o.   MRN: [id number]  HPI Patient complain of bilateral upper and lower eyelids for approximate 1 month.  Pain is associated with itching.  Denies vision loss.  Remaining facial area is free from edema or erythema.  Unsure of trigger effect.   Review of Systems Negative save for above complaint    Objective:   Physical Exam HEENT is remarkable for edematous but not erythematous upper and lower bilateral eyelids.  No drainage.  No signs conjunctiva open lesions. Neck is supple for lymphadenopathy or bruits. Lungs are clear to auscultation. Heart regular rate and rhythm.       Assessment & Plan: Contact dermatitis  Patient advised to discontinue all upper and lower eyelid applications.  Patient given a prescription for Atarax  and Medrol  Dosepak.  Patient is returning back in 1 week for reevaluation.  Sooner if condition worsens.  "

## 2024-01-21 ENCOUNTER — Ambulatory Visit: Payer: Self-pay | Admitting: Physician Assistant

## 2024-01-21 ENCOUNTER — Other Ambulatory Visit: Payer: Self-pay

## 2024-01-21 ENCOUNTER — Encounter: Payer: Self-pay | Admitting: Physician Assistant

## 2024-01-21 DIAGNOSIS — L250 Unspecified contact dermatitis due to cosmetics: Secondary | ICD-10-CM

## 2024-01-21 MED ORDER — HYDROXYZINE PAMOATE 25 MG PO CAPS: 25.0000 mg | ORAL_CAPSULE | Freq: Three times a day (TID) | ORAL | 0 refills | Status: AC | PRN

## 2024-01-21 NOTE — Progress Notes (Signed)
 Reports continued allergic type reaction when applying makeup to her eyelids and has finished the steroid pack several days ago.  Still taking the antihistamine.

## 2024-01-21 NOTE — Progress Notes (Signed)
" ° °  Subjective: Rash    Patient ID: Tami Rivera, female    DOB: 12/02/1965, 59 y.o.   MRN: [id number]  HPI Patient follow-up 1 week status post starting Medrol  Dosepak and Atarax  for bilateral upper and lower eyelid edema.  Patient states she stopped using the suspected moisturizer but has noticed no difference in the complaint.  Patient states she did use the suspected moisturizer 3 days ago.  Denies vision disturbance.   Review of Systems Negative save for above complaints    Objective:   Physical Exam Patient has bilateral upper and lower eyelid edema.  Minimal reduction of edema from last exam 8 days ago.      Assessment & Plan: Contact dermatitis  Patient requests consult with dermatology but will continue to use the Atarax  since she has noticed improvement with the itching.  "

## 2024-01-22 NOTE — Addendum Note (Signed)
 Addended by: ELUTERIO JENKINS HERO on: 01/22/2024 09:43 AM   Modules accepted: Orders

## 2024-01-29 ENCOUNTER — Ambulatory Visit

## 2024-01-29 DIAGNOSIS — L259 Unspecified contact dermatitis, unspecified cause: Secondary | ICD-10-CM

## 2024-01-29 DIAGNOSIS — L239 Allergic contact dermatitis, unspecified cause: Secondary | ICD-10-CM | POA: Diagnosis not present

## 2024-01-29 MED ORDER — TACROLIMUS 0.1 % EX OINT: TOPICAL_OINTMENT | CUTANEOUS | 5 refills | Status: AC

## 2024-01-29 NOTE — Patient Instructions (Addendum)
 Moisturizer: Apply a moisturizer throughout the day and after bathing.  When you moisturize after bathing, this locks in the moisture.  This can lead to softer and smoother skin.  Body moisturizers come in ointments, creams, and lotions.  If you have dry skin, we recommend the use of ointments or creams rather than lotions.  In other words, something you scoop out of a jar rather than squirted out.  Ointments and creams are thicker and thus provide better moisturization.      Moisturizers Apply a moisturizer to your skin at least once a day (even if you do not bathe).   Cool moisturizers on your skin help with itching (to accomplish this, place your moisturizers and medicated creams in the refrigerator). In general, people with dry skin need a moisturizer that is scooped and not squirted.  - Ointments (petrolatum ointment): greasy, but are the best moisturizers Vaseline, Aquaphor  - Creams (thick, white cream that comes in a jar and is scooped with your hand) Cerave, Cetaphil, Eucerin, Vanicream  - Lotions (comes in a pump) is the weakest moisturizer but is an acceptable choice for the face if you have an oily face Cetaphil, Cerave, Curel, Neutrogena, Lubriderm, Aveeno    Due to recent changes in healthcare laws, you may see results of your pathology and/or laboratory studies on MyChart before the doctors have had a chance to review them. We understand that in some cases there may be results that are confusing or concerning to you. Please understand that not all results are received at the same time and often the doctors may need to interpret multiple results in order to provide you with the best plan of care or course of treatment. Therefore, we ask that you please give us  2 business days to thoroughly review all your results before contacting the office for clarification. Should we see a critical lab result, you will be contacted sooner.   If You Need Anything After Your Visit  If you have  any questions or concerns for your doctor, please call our main line at [phone] and press option 4 to reach your doctor's medical assistant. If no one answers, please leave a voicemail as directed and we will return your call as soon as possible. Messages left after 4 pm will be answered the following business day.   You may also send us  a message via MyChart. We typically respond to MyChart messages within 1-2 business days.  For prescription refills, please ask your pharmacy to contact our office. Our fax number is [phone].  If you have an urgent issue when the clinic is closed that cannot wait until the next business day, you can page your doctor at the number below.    Please note that while we do our best to be available for urgent issues outside of office hours, we are not available 24/7.   If you have an urgent issue and are unable to reach us , you may choose to seek medical care at your doctor's office, retail clinic, urgent care center, or emergency room.  If you have a medical emergency, please immediately call 911 or go to the emergency department.  Pager Numbers  - Dr. Hester: [phone]  - Dr. Jackquline: [phone]  - Dr. Claudene: [phone]   In the event of inclement weather, please call our main line at [phone] for an update on the status of any delays or closures.  Dermatology Medication Tips: Please keep the boxes that topical medications come in in order  to help keep track of the instructions about where and how to use these. Pharmacies typically print the medication instructions only on the boxes and not directly on the medication tubes.   If your medication is too expensive, please contact our office at [phone] option 4 or send us  a message through MyChart.   We are unable to tell what your co-pay for medications will be in advance as this is different depending on your insurance coverage. However, we may be able to find a substitute  medication at lower cost or fill out paperwork to get insurance to cover a needed medication.   If a prior authorization is required to get your medication covered by your insurance company, please allow us  1-2 business days to complete this process.  Drug prices often vary depending on where the prescription is filled and some pharmacies may offer cheaper prices.  The website www.goodrx.com contains coupons for medications through different pharmacies. The prices here do not account for what the cost may be with help from insurance (it may be cheaper with your insurance), but the website can give you the price if you did not use any insurance.  - You can print the associated coupon and take it with your prescription to the pharmacy.  - You may also stop by our office during regular business hours and pick up a GoodRx coupon card.  - If you need your prescription sent electronically to a different pharmacy, notify our office through [facility] or by phone at [phone] option 4.     Si Usted Necesita Algo Despus de Su Visita  Tambin puede enviarnos un mensaje a travs de Clinical cytogeneticist. Por lo general respondemos a los mensajes de MyChart en el transcurso de 1 a 2 das hbiles.  Para renovar recetas, por favor pida a su farmacia que se ponga en contacto con nuestra oficina. Randi lakes de fax es [place] [phone].  Si tiene un asunto urgente cuando la clnica est cerrada y que no puede esperar hasta el siguiente da hbil, puede llamar/localizar a su doctor(a) al nmero que aparece a continuacin.   Por favor, tenga en cuenta que aunque hacemos todo lo posible para estar disponibles para asuntos urgentes fuera del horario de [place], no estamos disponibles las 24 horas del da, los 7 [address]  [address] de la [place].   Si tiene un problema urgente y no puede comunicarse con nosotros, puede optar por buscar atencin mdica  en el consultorio de su doctor(a), en una clnica privada, en un centro de  atencin urgente o en una sala de emergencias.  Si tiene Engineer, drilling, por favor llame inmediatamente al 911 o vaya a la sala de emergencias.  Nmeros de bper  - Dr. Hester: [phone]  - Dra. Jackquline: [phone]  - Dr. Claudene: [phone]   En caso de inclemencias del tiempo, por favor llame a landry capes principal al [phone] para una actualizacin sobre el [place] de cualquier retraso o cierre.  Consejos para la medicacin en dermatologa: Por favor, guarde las cajas en las que vienen los medicamentos de uso tpico para ayudarle a seguir las instrucciones sobre dnde y cmo usarlos. Las farmacias generalmente imprimen las instrucciones del medicamento slo en las cajas y no directamente en los tubos del [place].   Si su medicamento es muy caro, por favor, pngase en contacto con landry rieger llamando al [phone] y presione la opcin 4 o envenos un mensaje a travs de Clinical cytogeneticist.   No podemos decirle cul ser su  copago por los medicamentos por adelantado ya que esto es diferente dependiendo de la cobertura de su seguro. Sin embargo, es posible que podamos encontrar un medicamento sustituto a Audiological scientist un formulario para que el seguro cubra el medicamento que se considera necesario.   Si se requiere una autorizacin previa para que su compaa de seguros malta su medicamento, por favor permtanos de 1 a 2 das hbiles para completar este proceso.  Los precios de los medicamentos varan con frecuencia dependiendo del Environmental consultant de dnde se surte la receta y alguna farmacias pueden ofrecer precios ms baratos.  El sitio web www.goodrx.com tiene cupones para medicamentos de Health and safety inspector. Los precios aqu no tienen en cuenta lo que podra costar con la ayuda del seguro (puede ser ms barato con su seguro), pero el sitio web puede darle el precio si no utiliz Tourist information centre manager.  - Puede imprimir el cupn correspondiente y llevarlo con su receta a la  farmacia.  - Tambin puede pasar por nuestra oficina durante el horario de atencin regular y Education officer, museum una tarjeta de cupones de GoodRx.  - Si necesita que su receta se enve electrnicamente a una farmacia diferente, informe a nuestra oficina a travs de MyChart de [place] o por telfono llamando al [phone] y presione la opcin 4.

## 2024-01-29 NOTE — Progress Notes (Signed)
" °  °  Subjective   Tami Rivera is a 59 y.o. female who presents for the following: Rash. Patient is new patient  Today patient reports: Patient reports middle of December started having eyelids burning/itching and went to see occupational health, was placed on prednisone  for 5 days and anti-histamine and did help with redness. Still burns when applying makeup or moisture. Patient had started washing face with Aveeno baby wash and moisturizer she has used before.   Review of Systems:    No other skin or systemic complaints except as noted in HPI or Assessment and Plan.  The following portions of the chart were reviewed this encounter and updated as appropriate: medications, allergies, medical history  Relevant Medical History:  n/a   Objective  (SKPE) Well appearing patient in no apparent distress; mood and affect are within normal limits. Examination was performed of the: Focused Exam of: Face   Examination notable for: periorbital erythema, some lichenification  Examination limited by: Undergarments, Shoes or socks , and Clothing     Assessment & Plan  (SKAP)   Allergic contact dermatitis - periorbital, trigger: unk  Chronic and persistent condition with duration or expected duration over one year. Condition is symptomatic and bothersome to patient. Patient is flaring and not currently at treatment goal.  - Prior treatments tried/failed: po pred x 5 days w/ some improvement   - Diagnosis, treatment options, prognosis, risk/ benefit, and side effects of treatment were discussed with the patient - Discussed that this dermatitis can develop at any time and may be new or old products or other substances that the patient is coming into contact with - Recommended gentle skin care, including avoidance of fragrance products, harsh soaps and detergents  Start tacrolimus  ointment 0.1% twice daily on eyelids.  Educated about black box warning (and also that recent studies show no increased  malignancy risk) and common adverse effects such as burning with application (advised to cool in fridge and only apply to bone-dry skin). Given location of eruption, unsafe to use daily topical CS to area. Patient requires topical calcineurin inhibitor given high risk of dyspigmentation and atrophy from topical CS use in sensitive area. - 0.03% ointment approved for > 70 years old 0.1% ointment approved for > 62 years old  - Discussed patch testing in the future if not improving.     Was sun protection counseling provided?: No   Level of service outlined above   Patient instructions (SKPI)   Procedures, orders, diagnosis for this visit:    There are no diagnoses linked to this encounter.  Return to clinic: Return for 6-8 weeks contact dermatitis f/u, w/ Dr. Raymund.  LILLETTE Lonell ROCKFORD Wilfred, CMA, am acting as scribe for Lauraine JAYSON Raymund, MD.  Documentation: I have reviewed the above documentation for accuracy and completeness, and I agree with the above.  Lauraine JAYSON Raymund, MD  "

## 2024-02-02 ENCOUNTER — Ambulatory Visit

## 2024-03-25 ENCOUNTER — Ambulatory Visit
